# Patient Record
Sex: Male | Born: 1990
Health system: Southern US, Community
[De-identification: ages and names within clinical notes are randomized; demographics above are authoritative.]

## PROBLEM LIST (undated history)

## (undated) DIAGNOSIS — M419 Scoliosis, unspecified: Secondary | ICD-10-CM

## (undated) HISTORY — PX: CYST REMOVAL NECK: SHX6281

## (undated) HISTORY — PX: CHOLECYSTECTOMY: SHX55

---

## 2016-04-25 ENCOUNTER — Emergency Department (HOSPITAL_COMMUNITY)
Admission: EM | Admit: 2016-04-25 | Discharge: 2016-04-25 | Disposition: A | Discharge status: Home / Self Care | Payer: Self-pay | Attending: Emergency Medicine | Admitting: Emergency Medicine

## 2016-04-25 ENCOUNTER — Encounter (HOSPITAL_COMMUNITY): Payer: Self-pay

## 2016-04-25 DIAGNOSIS — J029 Acute pharyngitis, unspecified: Secondary | ICD-10-CM | POA: Insufficient documentation

## 2016-04-25 DIAGNOSIS — K121 Other forms of stomatitis: Secondary | ICD-10-CM

## 2016-04-25 DIAGNOSIS — K120 Recurrent oral aphthae: Secondary | ICD-10-CM | POA: Insufficient documentation

## 2016-04-25 DIAGNOSIS — J028 Acute pharyngitis due to other specified organisms: Secondary | ICD-10-CM

## 2016-04-25 DIAGNOSIS — B9789 Other viral agents as the cause of diseases classified elsewhere: Secondary | ICD-10-CM

## 2016-04-25 LAB — RAPID STREP SCREEN (MED CTR MEBANE ONLY): STREPTOCOCCUS, GROUP A SCREEN (DIRECT): NEGATIVE

## 2016-04-25 MED ORDER — IBUPROFEN 400 MG PO TABS
800.0000 mg | ORAL_TABLET | Freq: Once | ORAL | Status: AC
  Administered 2016-04-25: 800 mg via ORAL
  Filled 2016-04-25: qty 2

## 2016-04-25 MED ORDER — NAPROXEN 500 MG PO TABS: 500.0000 mg | ORAL_TABLET | Freq: Two times a day (BID) | ORAL | 0 refills | Status: DC

## 2016-04-25 MED ORDER — MAGIC MOUTHWASH W/LIDOCAINE: 5.0000 mL | Freq: Three times a day (TID) | ORAL | 0 refills | Status: DC | PRN

## 2016-04-25 MED ORDER — MAGIC MOUTHWASH
5.0000 mL | Freq: Once | ORAL | Status: AC
  Administered 2016-04-25: 5 mL via ORAL
  Filled 2016-04-25: qty 5

## 2016-04-25 NOTE — ED Provider Notes (Signed)
[facility]-EMERGENCY DEPT Provider Note   CSN: [id number] Arrival date & time: 04/25/16  1625  First Provider Contact:  None    By signing my name below, I, Majel Homer, attest that this documentation has been prepared under the direction and in the presence of non-physician practitioner, [facility], [place]. Electronically Signed: Majel Homer, Scribe. 04/25/2016. 5:25 PM.  History   Chief Complaint Chief Complaint  Patient presents with  . Otalgia  . Sore Throat   The history is provided by the patient. No language interpreter was used.   HPI Comments: Todd Montes is a 25 y.o. male who presents to the Emergency Department complaining of gradually worsening, sore throat,  left ear pain and headache that began last night and worsened today. Pt reports he awoke from sleep this morning with a severe sore throat and "punding" headache with associated sinus pressure, difficulty swallowing and chills. He states he has taken tylenol with mild relief of his sore throat. He denies rhinorrhea, fever, nausea, vomiting and abdominal pain     History reviewed. No pertinent past medical history.  There are no active problems to display for this patient.  Past Surgical History:  Procedure Laterality Date  . CYST REMOVAL NECK      Home Medications    Prior to Admission medications   Medication Sig Start Date End Date Taking? Authorizing Provider  magic mouthwash w/lidocaine SOLN Take 5 mLs by mouth 3 (three) times daily as needed for mouth pain. 04/25/16   Hope Orlene Och, NP  naproxen (NAPROSYN) 500 MG tablet Take 1 tablet (500 mg total) by mouth 2 (two) times daily. 04/25/16   Hope Orlene Och, NP   Family History History reviewed. No pertinent family history.  Social History Social History  Substance Use Topics  . Smoking status: Never Smoker  . Smokeless tobacco: Never Used  . Alcohol use Yes     Comment: occ    Allergies   Review of patient's allergies indicates no known allergies.  Review of  Systems Review of Systems  Constitutional: Positive for chills. Negative for fever.  HENT: Positive for congestion, ear pain (left), sore throat and trouble swallowing. Negative for rhinorrhea.   Gastrointestinal: Negative for abdominal pain, nausea and vomiting.  Neurological: Positive for headaches.  all other systems negative  Physical Exam Updated Vital Signs BP 118/65   Pulse 84   Temp 98.6 F (37 C) (Oral)   Resp 18   Ht 6' (1.829 m)   Wt 178 lb (80.7 kg)   SpO2 99%   BMI 24.14 kg/m   Physical Exam  Constitutional: He is oriented to person, place, and time. He appears well-developed and well-nourished.  HENT:  Head: Normocephalic and atraumatic.  Right Ear: Tympanic membrane normal.  Left Ear: Tympanic membrane normal.  Mouth/Throat: Oral lesions present. Posterior oropharyngeal erythema present. No tonsillar abscesses.  Ulcer areas noted to posterior pharynx.  Eyes: EOM are normal.  Neck: Normal range of motion.  Left cervical adenopathy   Cardiovascular: Normal rate and regular rhythm.   Pulmonary/Chest: Effort normal and breath sounds normal. No respiratory distress.  Lungs clear bilaterally  Abdominal: Soft. Bowel sounds are normal. He exhibits no distension. There is no tenderness.  Musculoskeletal: Normal range of motion. He exhibits no edema.  No CVA tenderness  Neurological: He is alert and oriented to person, place, and time.  Skin: Skin is warm and dry. No rash noted. No erythema.  Psychiatric: He has a normal mood and affect. His  behavior is normal.  Nursing note and vitals reviewed.  ED Treatments / Results  Labs Results for orders placed or performed during the hospital encounter of 04/25/16  Rapid strep screen  Result Value Ref Range   Streptococcus, Group A Screen (Direct) NEGATIVE NEGATIVE    Radiology No results found.  Procedures Procedures  DIAGNOSTIC STUDIES:  Oxygen Saturation is 99% on RA, normal by my interpretation.     COORDINATION OF CARE:  5:22 PM Discussed treatment plan with pt which includes magic mouthwash at bedside and pt agreed to plan.  Medications Ordered in ED Medications  magic mouthwash (5 mLs Oral Given 04/25/16 1806)  ibuprofen (ADVIL,MOTRIN) tablet 800 mg (800 mg Oral Given 04/25/16 1738)    Initial Impression / Assessment and Plan / ED Course  I have reviewed the triage vital signs and the nursing notes.  Pertinent lab results that were available during my care of the patient were reviewed by me and considered in my medical decision making (see chart for details).  Clinical Course  Negative rapid strep screen, culture pending Magic mouth wash, ibuprofen Discussed with the patient findings and plan of care and all questioned fully answered. He will return if any problems arise.  I personally performed the services described in this documentation, which was scribed in my presence. The recorded information has been reviewed and is accurate.   Final Clinical Impressions(s) / ED Diagnoses  25 y.o. male with sore throat stable for d/c without fever, tonsillar abscess, difficulty swallowing, meningeal signs and does not appear toxic.   Final diagnoses:  Sore throat (viral)  Mouth ulcers    New Prescriptions Discharge Medication List as of 04/25/2016  5:49 PM    START taking these medications   Details  magic mouthwash w/lidocaine SOLN Take 5 mLs by mouth 3 (three) times daily as needed for mouth pain., Starting Tue 04/25/2016, Print    naproxen (NAPROSYN) 500 MG tablet Take 1 tablet (500 mg total) by mouth 2 (two) times daily., Starting Tue 04/25/2016, [address]         Hope [place] Neese, NP 04/25/16 2022    Loren Raceravid Yelverton, MD 04/27/16 [phone]

## 2016-04-25 NOTE — ED Triage Notes (Signed)
Onset yesterday head congestion, left ear pain and sore throat.  No ear drainage, fever, difficulty swallowing.

## 2016-04-28 LAB — CULTURE, GROUP A STREP (THRC)

## 2017-07-19 DIAGNOSIS — M25511 Pain in right shoulder: Secondary | ICD-10-CM | POA: Diagnosis not present

## 2017-07-19 DIAGNOSIS — X58XXXA Exposure to other specified factors, initial encounter: Secondary | ICD-10-CM | POA: Diagnosis not present

## 2017-07-19 DIAGNOSIS — S42132A Displaced fracture of coracoid process, left shoulder, initial encounter for closed fracture: Secondary | ICD-10-CM | POA: Diagnosis not present

## 2017-07-19 DIAGNOSIS — S42101A Fracture of unspecified part of scapula, right shoulder, initial encounter for closed fracture: Secondary | ICD-10-CM | POA: Diagnosis not present

## 2017-07-19 DIAGNOSIS — S43101A Unspecified dislocation of right acromioclavicular joint, initial encounter: Secondary | ICD-10-CM | POA: Diagnosis not present

## 2017-11-26 DIAGNOSIS — K7689 Other specified diseases of liver: Secondary | ICD-10-CM | POA: Diagnosis not present

## 2018-03-28 DIAGNOSIS — Z79899 Other long term (current) drug therapy: Secondary | ICD-10-CM | POA: Diagnosis not present

## 2018-04-05 DIAGNOSIS — Z79899 Other long term (current) drug therapy: Secondary | ICD-10-CM | POA: Diagnosis not present

## 2018-04-19 DIAGNOSIS — Z79899 Other long term (current) drug therapy: Secondary | ICD-10-CM | POA: Diagnosis not present

## 2018-06-17 DIAGNOSIS — Z130 Encounter for screening for diseases of the blood and blood-forming organs and certain disorders involving the immune mechanism: Secondary | ICD-10-CM | POA: Diagnosis not present

## 2018-06-17 DIAGNOSIS — F1910 Other psychoactive substance abuse, uncomplicated: Secondary | ICD-10-CM | POA: Diagnosis not present

## 2018-06-17 DIAGNOSIS — Z733 Stress, not elsewhere classified: Secondary | ICD-10-CM | POA: Diagnosis not present

## 2018-06-17 DIAGNOSIS — J45998 Other asthma: Secondary | ICD-10-CM | POA: Diagnosis not present

## 2018-06-17 DIAGNOSIS — F17210 Nicotine dependence, cigarettes, uncomplicated: Secondary | ICD-10-CM | POA: Diagnosis not present

## 2018-06-17 DIAGNOSIS — E663 Overweight: Secondary | ICD-10-CM | POA: Diagnosis not present

## 2018-06-17 DIAGNOSIS — Z0189 Encounter for other specified special examinations: Secondary | ICD-10-CM | POA: Diagnosis not present

## 2018-07-05 DIAGNOSIS — Z79899 Other long term (current) drug therapy: Secondary | ICD-10-CM | POA: Diagnosis not present

## 2018-08-02 DIAGNOSIS — Z79899 Other long term (current) drug therapy: Secondary | ICD-10-CM | POA: Diagnosis not present

## 2018-08-07 DIAGNOSIS — J069 Acute upper respiratory infection, unspecified: Secondary | ICD-10-CM | POA: Diagnosis not present

## 2018-08-07 DIAGNOSIS — B9789 Other viral agents as the cause of diseases classified elsewhere: Secondary | ICD-10-CM | POA: Diagnosis not present

## 2018-08-21 DIAGNOSIS — R945 Abnormal results of liver function studies: Secondary | ICD-10-CM | POA: Diagnosis not present

## 2018-08-21 DIAGNOSIS — R197 Diarrhea, unspecified: Secondary | ICD-10-CM | POA: Diagnosis not present

## 2018-08-21 DIAGNOSIS — R110 Nausea: Secondary | ICD-10-CM | POA: Diagnosis not present

## 2018-08-21 DIAGNOSIS — B182 Chronic viral hepatitis C: Secondary | ICD-10-CM | POA: Diagnosis not present

## 2018-08-27 DIAGNOSIS — R1013 Epigastric pain: Secondary | ICD-10-CM | POA: Diagnosis not present

## 2018-08-27 DIAGNOSIS — R110 Nausea: Secondary | ICD-10-CM | POA: Diagnosis not present

## 2018-08-28 DIAGNOSIS — R945 Abnormal results of liver function studies: Secondary | ICD-10-CM | POA: Diagnosis not present

## 2018-08-28 DIAGNOSIS — R197 Diarrhea, unspecified: Secondary | ICD-10-CM | POA: Diagnosis not present

## 2018-08-28 DIAGNOSIS — R110 Nausea: Secondary | ICD-10-CM | POA: Diagnosis not present

## 2018-08-30 DIAGNOSIS — Z79899 Other long term (current) drug therapy: Secondary | ICD-10-CM | POA: Diagnosis not present

## 2018-09-04 DIAGNOSIS — R1011 Right upper quadrant pain: Secondary | ICD-10-CM | POA: Diagnosis not present

## 2018-09-04 DIAGNOSIS — B182 Chronic viral hepatitis C: Secondary | ICD-10-CM | POA: Diagnosis not present

## 2018-09-04 DIAGNOSIS — K8010 Calculus of gallbladder with chronic cholecystitis without obstruction: Secondary | ICD-10-CM | POA: Diagnosis not present

## 2018-09-09 DIAGNOSIS — K8010 Calculus of gallbladder with chronic cholecystitis without obstruction: Secondary | ICD-10-CM | POA: Diagnosis not present

## 2018-09-09 DIAGNOSIS — K915 Postcholecystectomy syndrome: Secondary | ICD-10-CM | POA: Diagnosis not present

## 2018-09-09 DIAGNOSIS — K8018 Calculus of gallbladder with other cholecystitis without obstruction: Secondary | ICD-10-CM | POA: Diagnosis not present

## 2019-08-07 ENCOUNTER — Other Ambulatory Visit: Payer: Self-pay

## 2019-08-07 ENCOUNTER — Emergency Department (HOSPITAL_BASED_OUTPATIENT_CLINIC_OR_DEPARTMENT_OTHER)
Admission: EM | Admit: 2019-08-07 | Discharge: 2019-08-07 | Disposition: A | Discharge status: Home / Self Care | Payer: No Typology Code available for payment source | Attending: Emergency Medicine | Admitting: Emergency Medicine

## 2019-08-07 ENCOUNTER — Emergency Department (HOSPITAL_BASED_OUTPATIENT_CLINIC_OR_DEPARTMENT_OTHER): Payer: No Typology Code available for payment source

## 2019-08-07 ENCOUNTER — Encounter (HOSPITAL_BASED_OUTPATIENT_CLINIC_OR_DEPARTMENT_OTHER): Payer: Self-pay

## 2019-08-07 DIAGNOSIS — F17210 Nicotine dependence, cigarettes, uncomplicated: Secondary | ICD-10-CM | POA: Insufficient documentation

## 2019-08-07 DIAGNOSIS — M545 Low back pain: Secondary | ICD-10-CM | POA: Diagnosis not present

## 2019-08-07 HISTORY — DX: Scoliosis, unspecified: M41.9

## 2019-08-07 MED ORDER — CYCLOBENZAPRINE HCL 10 MG PO TABS: 10.0000 mg | ORAL_TABLET | Freq: Every day | ORAL | 0 refills | Status: AC

## 2019-08-07 MED ORDER — NAPROXEN 500 MG PO TABS: 500.0000 mg | ORAL_TABLET | Freq: Two times a day (BID) | ORAL | 0 refills | Status: AC | PRN

## 2019-08-07 MED FILL — NAPROXEN 500 MG TABS: 500 | 15 days supply | Qty: 30 | Fill #0

## 2019-08-07 MED FILL — CYCLOBENZAPRINE HCL 10 MG T: 10 | 10 days supply | Qty: 10 | Fill #0
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
## 2019-08-07 NOTE — Discharge Instructions (Signed)
Please read the attachment.  Please take your medications, as prescribed.  You were given a prescription for Flexeril which is a muscle relaxer.  You should not drive, work, consume alcohol, or operate machinery while taking this medication as it can make you very drowsy.
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
